# Patient Record
Sex: Female | Born: 1955 | Race: White | Hispanic: No | Marital: Married | Smoking: Never smoker
Health system: Southern US, Community
[De-identification: ages and names within clinical notes are randomized; demographics above are authoritative.]

## PROBLEM LIST (undated history)

## (undated) DIAGNOSIS — M810 Age-related osteoporosis without current pathological fracture: Secondary | ICD-10-CM

## (undated) DIAGNOSIS — T7840XA Allergy, unspecified, initial encounter: Secondary | ICD-10-CM

## (undated) DIAGNOSIS — R55 Syncope and collapse: Secondary | ICD-10-CM

## (undated) DIAGNOSIS — R569 Unspecified convulsions: Secondary | ICD-10-CM

## (undated) DIAGNOSIS — D649 Anemia, unspecified: Secondary | ICD-10-CM

## (undated) HISTORY — DX: Anemia, unspecified: D64.9

## (undated) HISTORY — DX: Age-related osteoporosis without current pathological fracture: M81.0

## (undated) HISTORY — DX: Syncope and collapse: R55

## (undated) HISTORY — PX: POLYPECTOMY: SHX149

## (undated) HISTORY — DX: Unspecified convulsions: R56.9

## (undated) HISTORY — DX: Allergy, unspecified, initial encounter: T78.40XA

## (undated) HISTORY — PX: COLONOSCOPY: SHX174

---

## 1999-08-25 ENCOUNTER — Encounter: Admission: RE | Admit: 1999-08-25 | Discharge: 1999-08-25 | Payer: Self-pay | Admitting: Emergency Medicine

## 1999-08-25 ENCOUNTER — Encounter: Payer: Self-pay | Admitting: Emergency Medicine

## 1999-09-01 ENCOUNTER — Encounter: Payer: Self-pay | Admitting: Emergency Medicine

## 1999-09-01 ENCOUNTER — Encounter: Admission: RE | Admit: 1999-09-01 | Discharge: 1999-09-01 | Payer: Self-pay | Admitting: Emergency Medicine

## 2000-08-02 ENCOUNTER — Other Ambulatory Visit: Admission: RE | Admit: 2000-08-02 | Discharge: 2000-08-02 | Payer: Self-pay | Admitting: Emergency Medicine

## 2000-09-02 ENCOUNTER — Encounter: Admission: RE | Admit: 2000-09-02 | Discharge: 2000-09-02 | Payer: Self-pay | Admitting: Emergency Medicine

## 2000-09-02 ENCOUNTER — Encounter: Payer: Self-pay | Admitting: Emergency Medicine

## 2002-05-20 ENCOUNTER — Encounter: Admission: RE | Admit: 2002-05-20 | Discharge: 2002-05-20 | Payer: Self-pay | Admitting: Emergency Medicine

## 2002-05-20 ENCOUNTER — Encounter: Payer: Self-pay | Admitting: Emergency Medicine

## 2003-01-15 ENCOUNTER — Encounter: Admission: RE | Admit: 2003-01-15 | Discharge: 2003-01-15 | Payer: Self-pay | Admitting: Emergency Medicine

## 2003-01-15 ENCOUNTER — Encounter: Payer: Self-pay | Admitting: Emergency Medicine

## 2003-10-22 ENCOUNTER — Encounter: Admission: RE | Admit: 2003-10-22 | Discharge: 2003-10-22 | Payer: Self-pay | Admitting: Emergency Medicine

## 2004-12-27 ENCOUNTER — Ambulatory Visit: Payer: Self-pay | Admitting: Gastroenterology

## 2005-02-06 ENCOUNTER — Ambulatory Visit: Payer: Self-pay | Admitting: Gastroenterology

## 2005-02-12 ENCOUNTER — Encounter: Admission: RE | Admit: 2005-02-12 | Discharge: 2005-02-12 | Payer: Self-pay | Admitting: Emergency Medicine

## 2005-02-21 ENCOUNTER — Encounter: Admission: RE | Admit: 2005-02-21 | Discharge: 2005-02-21 | Payer: Self-pay | Admitting: Emergency Medicine

## 2006-04-05 ENCOUNTER — Encounter: Admission: RE | Admit: 2006-04-05 | Discharge: 2006-04-05 | Payer: Self-pay | Admitting: Emergency Medicine

## 2006-11-17 IMAGING — US US TRANSVAGINAL NON-OB
1 series · 14 of 25 positions shown · non-contrast
Comparison: none

CLINICAL DATA: Abnormal pap smear with endometrial cells.
 ULTRASOUND OF THE PELVIS, TRANSVAGINAL ULTRASOUND:
 Transabdominal and transvaginal ultrasound of the pelvis were performed.  The uterus measures 12.9 cm sagittally with a depth of 3.8 cm and width of 5.7 cm.  Probable small fibroid is noted in the mid fundus of 1.1 x 0.7 x 1.0 cm.  The endometrium measures 8.1 mm in thickness and appears homogeneous.  The ovaries are normal in size with the left ovary not being well seen.  Only a small amount of free fluid is noted.

[Series 1: unknown · 0.32mm/px · 14 of 40 slices shown]
[im 1/40]
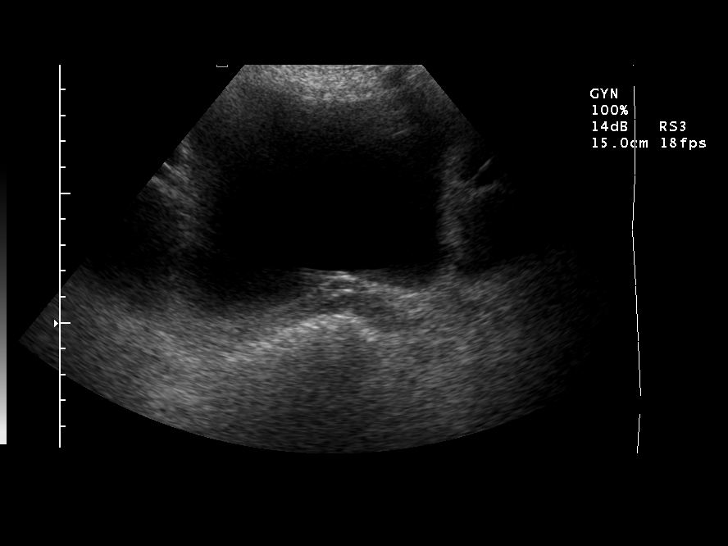
[im 4/40]
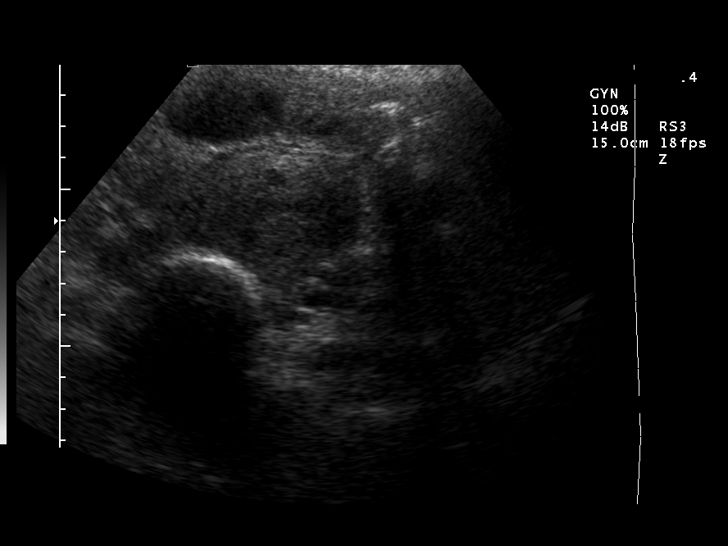
[im 7/40]
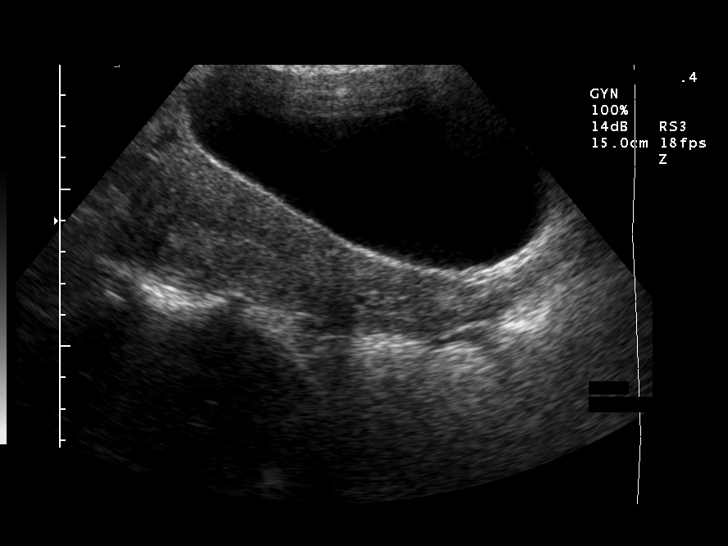
[im 10/40]
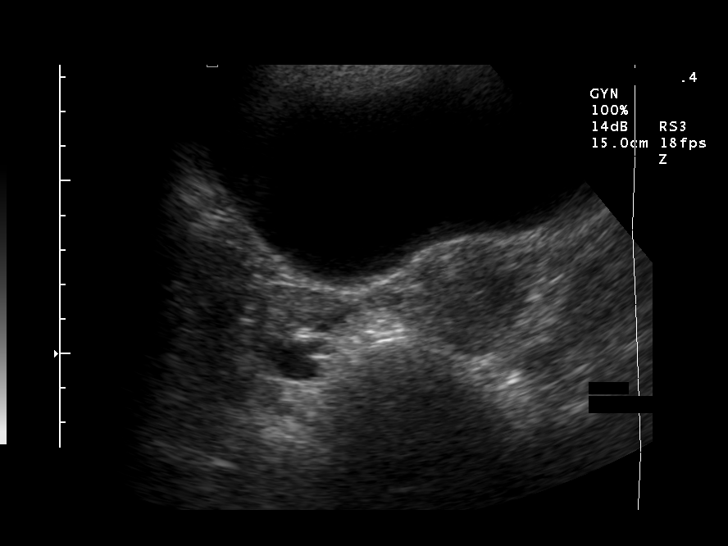
[im 14/40]
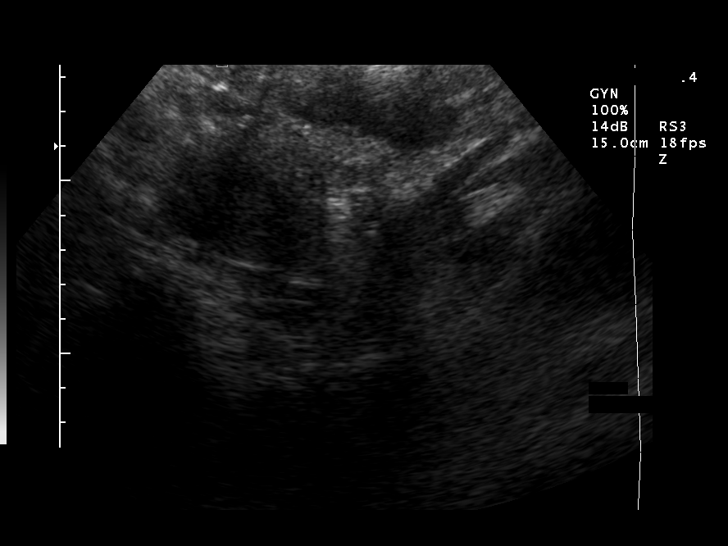
[im 15/40]
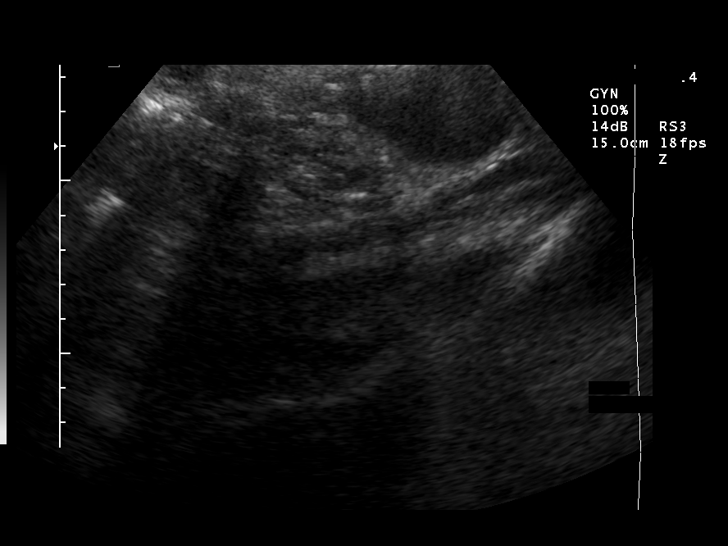
[im 18/40]
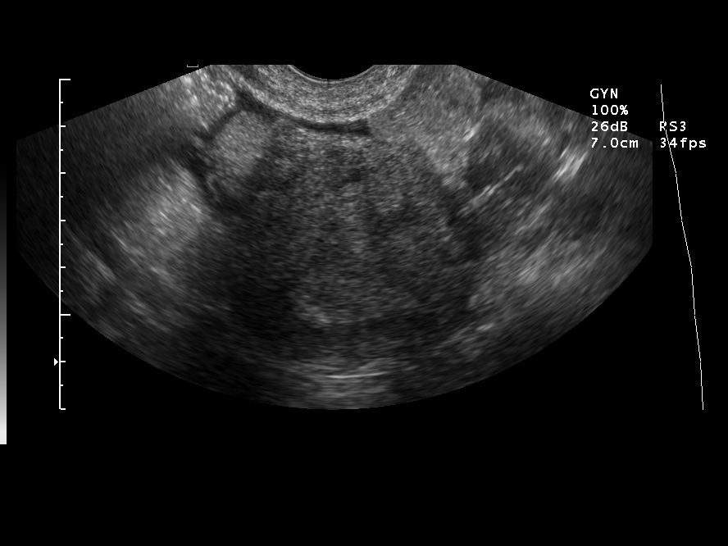
[im 22/40]
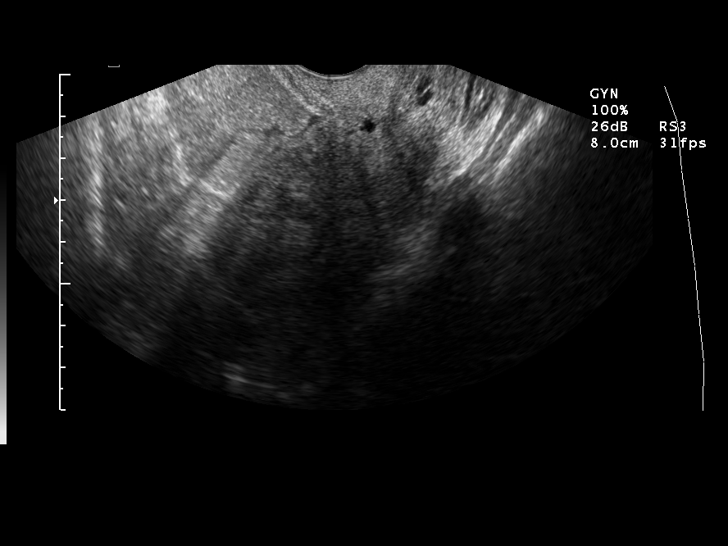
[im 25/40]
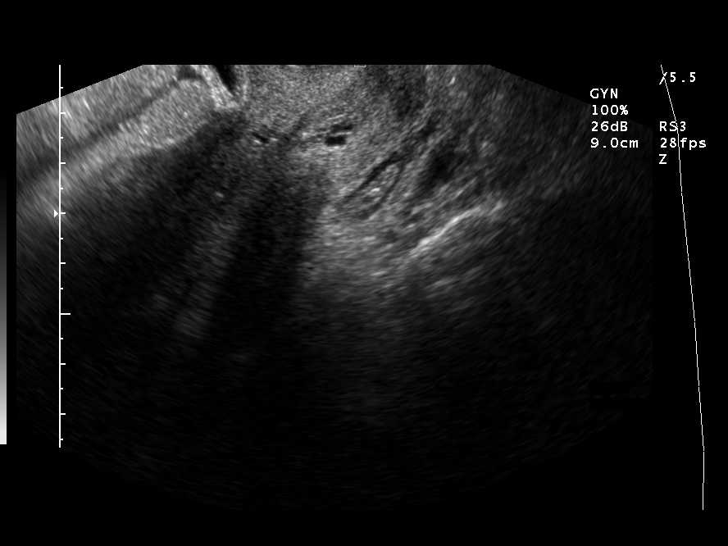
[im 27/40]
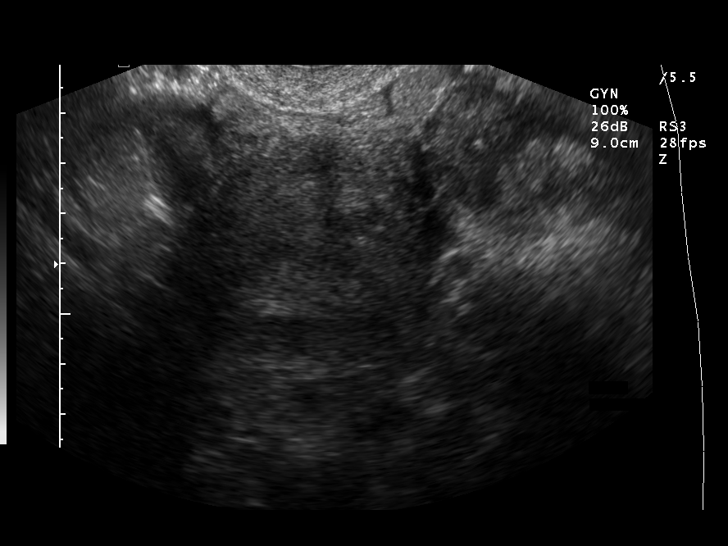
[im 30/40]
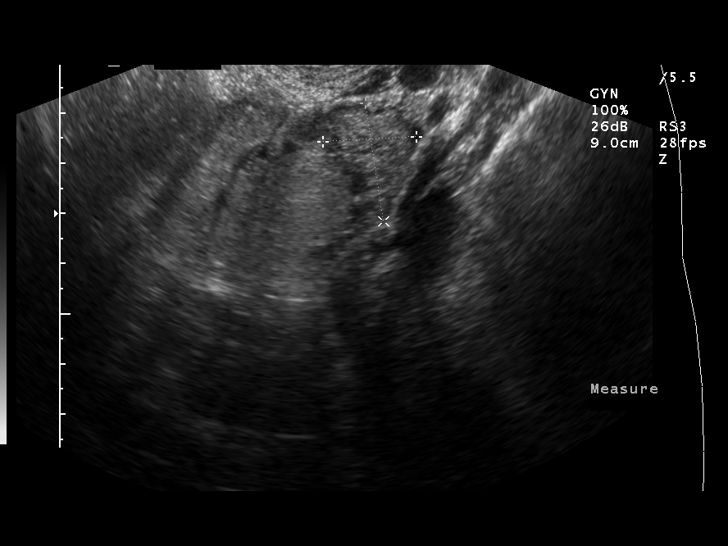
[im 33/40]
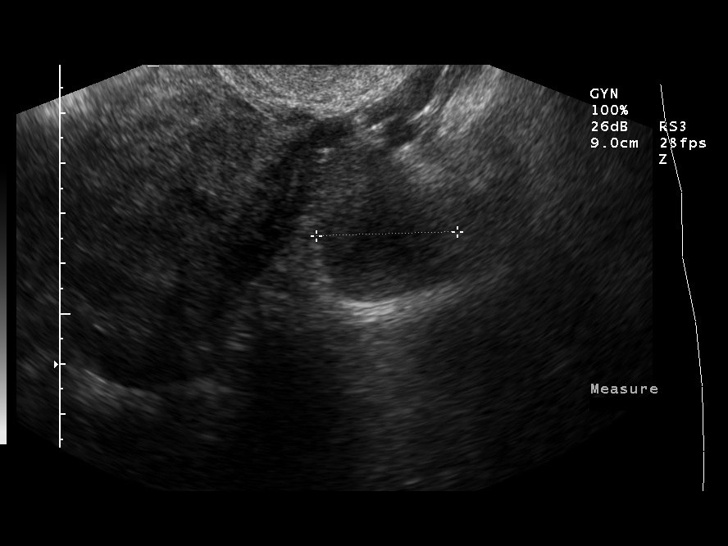
[im 36/40]
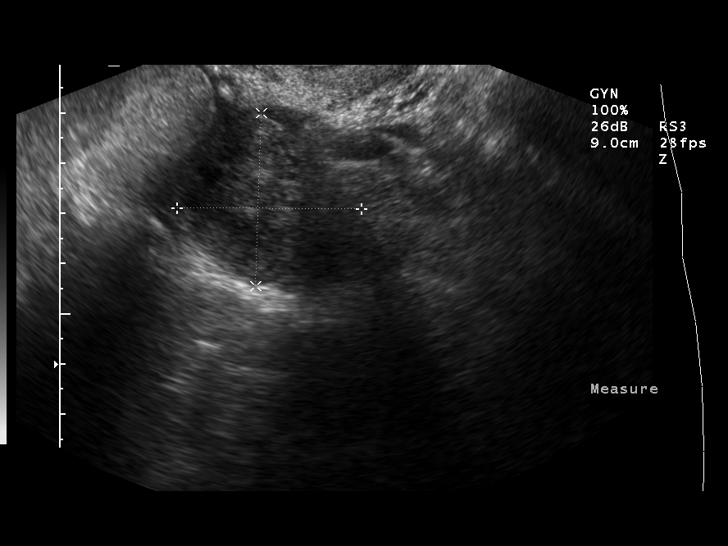
[im 40/40]
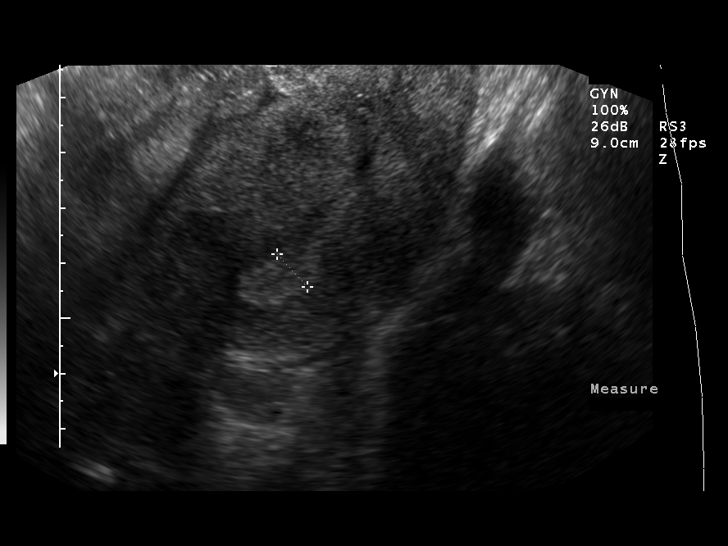

[14 of 25 positions shown; findings below may reference images not displayed]

IMPRESSION: Negative ultrasound of the pelvis.  Single small fundal fibroid of 1.1 cm.

## 2007-04-10 ENCOUNTER — Encounter: Admission: RE | Admit: 2007-04-10 | Discharge: 2007-04-10 | Payer: Self-pay | Admitting: Emergency Medicine

## 2008-04-19 ENCOUNTER — Encounter: Admission: RE | Admit: 2008-04-19 | Discharge: 2008-04-19 | Payer: Self-pay | Admitting: Emergency Medicine

## 2008-12-30 ENCOUNTER — Other Ambulatory Visit: Admission: RE | Admit: 2008-12-30 | Discharge: 2008-12-30 | Payer: Self-pay | Admitting: Family Medicine

## 2009-04-20 ENCOUNTER — Encounter: Admission: RE | Admit: 2009-04-20 | Discharge: 2009-04-20 | Payer: Self-pay | Admitting: Family Medicine

## 2009-04-21 ENCOUNTER — Encounter: Admission: RE | Admit: 2009-04-21 | Discharge: 2009-04-21 | Payer: Self-pay | Admitting: Family Medicine

## 2010-01-24 ENCOUNTER — Other Ambulatory Visit: Admission: RE | Admit: 2010-01-24 | Discharge: 2010-01-24 | Payer: Self-pay | Admitting: Family Medicine

## 2010-04-24 ENCOUNTER — Encounter: Admission: RE | Admit: 2010-04-24 | Discharge: 2010-04-24 | Payer: Self-pay | Admitting: Family Medicine

## 2011-01-29 ENCOUNTER — Other Ambulatory Visit (HOSPITAL_COMMUNITY)
Admission: RE | Admit: 2011-01-29 | Discharge: 2011-01-29 | Disposition: A | Discharge status: Home / Self Care | Payer: BC Managed Care – PPO | Source: Ambulatory Visit | Attending: Family Medicine | Admitting: Family Medicine

## 2011-01-29 ENCOUNTER — Other Ambulatory Visit: Payer: Self-pay | Admitting: Family Medicine

## 2011-01-29 DIAGNOSIS — Z124 Encounter for screening for malignant neoplasm of cervix: Secondary | ICD-10-CM | POA: Insufficient documentation

## 2011-04-05 ENCOUNTER — Other Ambulatory Visit: Payer: Self-pay | Admitting: Family Medicine

## 2011-04-05 DIAGNOSIS — Z1231 Encounter for screening mammogram for malignant neoplasm of breast: Secondary | ICD-10-CM

## 2011-04-30 ENCOUNTER — Ambulatory Visit
Admission: RE | Admit: 2011-04-30 | Discharge: 2011-04-30 | Disposition: A | Discharge status: Home / Self Care | Payer: BC Managed Care – PPO | Source: Ambulatory Visit | Attending: Family Medicine | Admitting: Family Medicine

## 2011-04-30 DIAGNOSIS — Z1231 Encounter for screening mammogram for malignant neoplasm of breast: Secondary | ICD-10-CM

## 2012-01-30 ENCOUNTER — Other Ambulatory Visit: Payer: Self-pay | Admitting: Family Medicine

## 2012-01-30 ENCOUNTER — Encounter: Payer: Self-pay | Admitting: Gastroenterology

## 2012-01-30 DIAGNOSIS — M810 Age-related osteoporosis without current pathological fracture: Secondary | ICD-10-CM

## 2012-02-14 ENCOUNTER — Ambulatory Visit
Admission: RE | Admit: 2012-02-14 | Discharge: 2012-02-14 | Disposition: A | Discharge status: Home / Self Care | Payer: BC Managed Care – PPO | Source: Ambulatory Visit | Attending: Family Medicine | Admitting: Family Medicine

## 2012-02-14 DIAGNOSIS — M810 Age-related osteoporosis without current pathological fracture: Secondary | ICD-10-CM

## 2012-03-18 ENCOUNTER — Ambulatory Visit (AMBULATORY_SURGERY_CENTER): Payer: BC Managed Care – PPO | Admitting: *Deleted

## 2012-03-18 VITALS — Ht 59.0 in | Wt 91.3 lb

## 2012-03-18 DIAGNOSIS — Z1211 Encounter for screening for malignant neoplasm of colon: Secondary | ICD-10-CM

## 2012-03-18 MED ORDER — NA SULFATE-K SULFATE-MG SULF 17.5-3.13-1.6 GM/177ML PO SOLN: 1.0000 | Freq: Once | ORAL | Status: DC

## 2012-03-19 ENCOUNTER — Encounter: Payer: Self-pay | Admitting: Gastroenterology

## 2012-04-01 ENCOUNTER — Encounter: Payer: Self-pay | Admitting: Gastroenterology

## 2012-04-01 ENCOUNTER — Ambulatory Visit (AMBULATORY_SURGERY_CENTER): Payer: BC Managed Care – PPO | Admitting: Gastroenterology

## 2012-04-01 VITALS — BP 139/87 | HR 76 | Temp 97.4°F | Resp 14 | Ht 59.0 in | Wt 91.0 lb

## 2012-04-01 DIAGNOSIS — D126 Benign neoplasm of colon, unspecified: Secondary | ICD-10-CM

## 2012-04-01 DIAGNOSIS — Z1211 Encounter for screening for malignant neoplasm of colon: Secondary | ICD-10-CM

## 2012-04-01 MED ORDER — SODIUM CHLORIDE 0.9 % IV SOLN: 500.0000 mL | INTRAVENOUS | Status: DC

## 2012-04-01 NOTE — Op Note (Signed)
[facility] [address] [place], [address]   COLONOSCOPY PROCEDURE REPORT  PATIENT: April, Kelly  MR#: [id number] BIRTHDATE: 13-Nov-1955 , 56  yrs. old GENDER: Female ENDOSCOPIST: Louis Meckel, MD REFERRED XB:JYNWGN Valentina Lucks, M.D. PROCEDURE DATE:  04/01/2012 PROCEDURE:   Colonoscopy with cold biopsy polypectomy ASA CLASS:   Class I INDICATIONS:average risk screening. MEDICATIONS: MAC sedation, administered by CRNA and propofol (Diprivan) 150mg  IV  DESCRIPTION OF PROCEDURE:   After the risks benefits and alternatives of the procedure were thoroughly explained, informed consent was obtained.  A digital rectal exam revealed no abnormalities of the rectum.   The LB [id number]  endoscope was introduced through the anus and advanced to the cecum, which was identified by both the appendix and ileocecal valve. No adverse events experienced.   The quality of the prep was Suprep excellent The instrument was then slowly withdrawn as the colon was fully examined.      COLON FINDINGS: A sessile polyp was found in the ascending colon.  A polypectomy was performed with cold forceps.  The resection was complete and the polyp tissue was completely retrieved.   The colon mucosa was otherwise normal.  Retroflexed views revealed no abnormalities. The time to cecum=5 minutes 08 seconds.  Withdrawal time=7 minutes 35 seconds.  The scope was withdrawn and the procedure completed. COMPLICATIONS: There were no complications.  ENDOSCOPIC IMPRESSION: 1.   Sessile polyp was found in the ascending colon; polypectomy was performed with cold forceps 2.   The colon mucosa was otherwise normal  RECOMMENDATIONS: If the polyp(s) removed today are proven to be adenomatous (pre-cancerous) polyps, you will need a repeat colonoscopy in 5 years.  Otherwise you should continue to follow colorectal cancer screening guidelines for "routine risk" patients with colonoscopy in  10 years.  You will receive a letter within 1-2 weeks with the results of your biopsy as well as final recommendations.  Please call my office if you have not received a letter after 3 weeks.   eSigned:  Louis Meckel, MD 04/01/2012 10:02 AM   cc:

## 2012-04-01 NOTE — Patient Instructions (Signed)
YOU HAD AN ENDOSCOPIC PROCEDURE TODAY AT THE [facility]: Refer to the procedure report that was given to you for any specific questions about what was found during the examination.  If the procedure report does not answer your questions, please call your gastroenterologist to clarify.  If you requested that your care partner not be given the details of your procedure findings, then the procedure report has been included in a sealed envelope for you to review at your convenience later.  YOU SHOULD EXPECT: Some feelings of bloating in the abdomen. Passage of more gas than usual.  Walking can help get rid of the air that was put into your GI tract during the procedure and reduce the bloating. If you had a lower endoscopy (such as a colonoscopy or flexible sigmoidoscopy) you may notice spotting of blood in your stool or on the toilet paper. If you underwent a bowel prep for your procedure, then you may not have a normal bowel movement for a few days.  DIET: Your first meal following the procedure should be a light meal and then it is ok to progress to your normal diet.  A half-sandwich or bowl of soup is an example of a good first meal.  Heavy or fried foods are harder to digest and may make you feel nauseous or bloated.  Likewise meals heavy in dairy and vegetables can cause extra gas to form and this can also increase the bloating.  Drink plenty of fluids but you should avoid alcoholic beverages for 24 hours.  ACTIVITY: Your care partner should take you home directly after the procedure.  You should plan to take it easy, moving slowly for the rest of the day.  You can resume normal activity the day after the procedure however you should NOT DRIVE or use heavy machinery for 24 hours (because of the sedation medicines used during the test).    SYMPTOMS TO REPORT IMMEDIATELY: A gastroenterologist can be reached at any hour.  During normal business hours, 8:30 AM to 5:00 PM Monday through Friday,  call [phone].  After hours and on weekends, please call the GI answering service at [phone] who will take a message and have the physician on call contact you.   Following lower endoscopy (colonoscopy or flexible sigmoidoscopy):  Excessive amounts of blood in the stool  Significant tenderness or worsening of abdominal pains  Swelling of the abdomen that is new, acute  Fever of 100F or higher   FOLLOW UP: If any biopsies were taken you will be contacted by phone or by letter within the next 1-3 weeks.  Call your gastroenterologist if you have not heard about the biopsies in 3 weeks.  Our staff will call the home number listed on your records the next business day following your procedure to check on you and address any questions or concerns that you may have at that time regarding the information given to you following your procedure. This is a courtesy call and so if there is no answer at the home number and we have not heard from you through the emergency physician on call, we will assume that you have returned to your regular daily activities without incident.  SIGNATURES/CONFIDENTIALITY: You and/or your care partner have signed paperwork which will be entered into your electronic medical record.  These signatures attest to the fact that that the information above on your After Visit Summary has been reviewed and is understood.  Full responsibility of the confidentiality of   this discharge information lies with you and/or your care-partner.   INFORMATION ON POLYPS GIVEN TO YOU TODAY 

## 2012-04-01 NOTE — Progress Notes (Signed)
Patient did not experience any of the following events: a burn prior to discharge; a fall within the facility; wrong site/side/patient/procedure/implant event; or a hospital transfer or hospital admission upon discharge from the facility. (G8907) Patient did not have preoperative order for IV antibiotic SSI prophylaxis. (G8918)  

## 2012-04-02 ENCOUNTER — Telehealth: Payer: Self-pay | Admitting: *Deleted

## 2012-04-02 NOTE — Telephone Encounter (Signed)
  Follow up Call-  Call back number 04/01/2012  Post procedure Call Back phone  # [phone]  Permission to leave phone message Yes     Patient questions:  Do you have a fever, pain , or abdominal swelling? no Pain Score  0 *  Have you tolerated food without any problems? yes  Have you been able to return to your normal activities? yes  Do you have any questions about your discharge instructions: Diet   no Medications  no Follow up visit  no  Do you have questions or concerns about your Care? no  Actions: * If pain score is 4 or above: No action needed, pain <4.

## 2012-04-08 ENCOUNTER — Encounter: Payer: Self-pay | Admitting: Gastroenterology

## 2012-04-09 ENCOUNTER — Other Ambulatory Visit: Payer: Self-pay | Admitting: Family Medicine

## 2012-04-09 DIAGNOSIS — Z1231 Encounter for screening mammogram for malignant neoplasm of breast: Secondary | ICD-10-CM

## 2012-05-02 ENCOUNTER — Ambulatory Visit
Admission: RE | Admit: 2012-05-02 | Discharge: 2012-05-02 | Disposition: A | Discharge status: Home / Self Care | Payer: BC Managed Care – PPO | Source: Ambulatory Visit | Attending: Family Medicine | Admitting: Family Medicine

## 2012-05-02 DIAGNOSIS — Z1231 Encounter for screening mammogram for malignant neoplasm of breast: Secondary | ICD-10-CM

## 2013-04-03 ENCOUNTER — Other Ambulatory Visit: Payer: Self-pay

## 2013-04-03 DIAGNOSIS — Z1231 Encounter for screening mammogram for malignant neoplasm of breast: Secondary | ICD-10-CM

## 2013-05-04 ENCOUNTER — Ambulatory Visit
Admission: RE | Admit: 2013-05-04 | Discharge: 2013-05-04 | Disposition: A | Discharge status: Home / Self Care | Payer: BC Managed Care – PPO | Source: Ambulatory Visit

## 2013-05-04 DIAGNOSIS — Z1231 Encounter for screening mammogram for malignant neoplasm of breast: Secondary | ICD-10-CM

## 2014-02-03 ENCOUNTER — Other Ambulatory Visit: Payer: Self-pay | Admitting: Family Medicine

## 2014-02-03 ENCOUNTER — Other Ambulatory Visit (HOSPITAL_COMMUNITY)
Admission: RE | Admit: 2014-02-03 | Discharge: 2014-02-03 | Disposition: A | Discharge status: Home / Self Care | Payer: BC Managed Care – PPO | Source: Ambulatory Visit | Attending: Family Medicine | Admitting: Family Medicine

## 2014-02-03 DIAGNOSIS — Z124 Encounter for screening for malignant neoplasm of cervix: Secondary | ICD-10-CM | POA: Insufficient documentation

## 2014-02-03 DIAGNOSIS — M810 Age-related osteoporosis without current pathological fracture: Secondary | ICD-10-CM

## 2014-02-04 LAB — CYTOLOGY - PAP

## 2014-02-24 ENCOUNTER — Ambulatory Visit
Admission: RE | Admit: 2014-02-24 | Discharge: 2014-02-24 | Disposition: A | Discharge status: Home / Self Care | Payer: BC Managed Care – PPO | Source: Ambulatory Visit | Attending: Family Medicine | Admitting: Family Medicine

## 2014-02-24 DIAGNOSIS — M810 Age-related osteoporosis without current pathological fracture: Secondary | ICD-10-CM

## 2014-04-07 ENCOUNTER — Other Ambulatory Visit: Payer: Self-pay

## 2014-04-07 DIAGNOSIS — Z1231 Encounter for screening mammogram for malignant neoplasm of breast: Secondary | ICD-10-CM

## 2014-05-10 ENCOUNTER — Ambulatory Visit
Admission: RE | Admit: 2014-05-10 | Discharge: 2014-05-10 | Disposition: A | Discharge status: Home / Self Care | Payer: BC Managed Care – PPO | Source: Ambulatory Visit

## 2014-05-10 DIAGNOSIS — Z1231 Encounter for screening mammogram for malignant neoplasm of breast: Secondary | ICD-10-CM

## 2014-12-22 ENCOUNTER — Encounter: Payer: Self-pay | Admitting: Gastroenterology

## 2015-04-19 ENCOUNTER — Other Ambulatory Visit: Payer: Self-pay

## 2015-04-19 DIAGNOSIS — Z1231 Encounter for screening mammogram for malignant neoplasm of breast: Secondary | ICD-10-CM

## 2015-05-16 ENCOUNTER — Ambulatory Visit
Admission: RE | Admit: 2015-05-16 | Discharge: 2015-05-16 | Disposition: A | Discharge status: Home / Self Care | Payer: BC Managed Care – PPO | Source: Ambulatory Visit

## 2015-05-16 DIAGNOSIS — Z1231 Encounter for screening mammogram for malignant neoplasm of breast: Secondary | ICD-10-CM

## 2016-02-13 ENCOUNTER — Other Ambulatory Visit: Payer: Self-pay | Admitting: Family Medicine

## 2016-02-13 ENCOUNTER — Ambulatory Visit
Admission: RE | Admit: 2016-02-13 | Discharge: 2016-02-13 | Disposition: A | Discharge status: Home / Self Care | Payer: BC Managed Care – PPO | Source: Ambulatory Visit | Attending: Family Medicine | Admitting: Family Medicine

## 2016-02-13 DIAGNOSIS — M79672 Pain in left foot: Secondary | ICD-10-CM

## 2016-02-15 ENCOUNTER — Other Ambulatory Visit: Payer: Self-pay | Admitting: Family Medicine

## 2016-02-15 DIAGNOSIS — M810 Age-related osteoporosis without current pathological fracture: Secondary | ICD-10-CM

## 2016-02-29 ENCOUNTER — Ambulatory Visit
Admission: RE | Admit: 2016-02-29 | Discharge: 2016-02-29 | Disposition: A | Discharge status: Home / Self Care | Payer: BC Managed Care – PPO | Source: Ambulatory Visit | Attending: Family Medicine | Admitting: Family Medicine

## 2016-02-29 DIAGNOSIS — M810 Age-related osteoporosis without current pathological fracture: Secondary | ICD-10-CM

## 2016-04-16 ENCOUNTER — Other Ambulatory Visit: Payer: Self-pay | Admitting: Family Medicine

## 2016-04-16 DIAGNOSIS — Z1231 Encounter for screening mammogram for malignant neoplasm of breast: Secondary | ICD-10-CM

## 2016-05-17 ENCOUNTER — Ambulatory Visit
Admission: RE | Admit: 2016-05-17 | Discharge: 2016-05-17 | Disposition: A | Discharge status: Home / Self Care | Payer: BC Managed Care – PPO | Source: Ambulatory Visit | Attending: Family Medicine | Admitting: Family Medicine

## 2016-05-17 DIAGNOSIS — Z1231 Encounter for screening mammogram for malignant neoplasm of breast: Secondary | ICD-10-CM

## 2017-03-27 ENCOUNTER — Other Ambulatory Visit (HOSPITAL_COMMUNITY)
Admission: RE | Admit: 2017-03-27 | Discharge: 2017-03-27 | Disposition: A | Discharge status: Home / Self Care | Payer: BC Managed Care – PPO | Source: Ambulatory Visit | Attending: Family Medicine | Admitting: Family Medicine

## 2017-03-27 ENCOUNTER — Encounter: Payer: Self-pay | Admitting: Gastroenterology

## 2017-03-27 ENCOUNTER — Other Ambulatory Visit: Payer: Self-pay | Admitting: Family Medicine

## 2017-03-27 DIAGNOSIS — Z01419 Encounter for gynecological examination (general) (routine) without abnormal findings: Secondary | ICD-10-CM | POA: Insufficient documentation

## 2017-03-29 LAB — CYTOLOGY - PAP: Diagnosis: NEGATIVE

## 2017-04-12 ENCOUNTER — Other Ambulatory Visit: Payer: Self-pay | Admitting: Family Medicine

## 2017-04-12 DIAGNOSIS — Z1231 Encounter for screening mammogram for malignant neoplasm of breast: Secondary | ICD-10-CM

## 2017-05-16 ENCOUNTER — Ambulatory Visit (AMBULATORY_SURGERY_CENTER): Payer: Self-pay | Admitting: *Deleted

## 2017-05-16 VITALS — Ht 59.0 in | Wt 97.4 lb

## 2017-05-16 DIAGNOSIS — Z86010 Personal history of colonic polyps: Secondary | ICD-10-CM

## 2017-05-16 MED ORDER — NA SULFATE-K SULFATE-MG SULF 17.5-3.13-1.6 GM/177ML PO SOLN: 1.0000 | Freq: Once | ORAL | 0 refills | Status: AC

## 2017-05-16 NOTE — Progress Notes (Signed)
No egg or soy allergy known to patient  No issues with past sedation with any surgeries  or procedures, no intubation problems  No diet pills per patient No home 02 use per patient  No blood thinners per patient  Pt denies issues with constipation  No A fib or A flutter  EMMI video sent to pt's e mail  

## 2017-05-20 ENCOUNTER — Ambulatory Visit
Admission: RE | Admit: 2017-05-20 | Discharge: 2017-05-20 | Disposition: A | Discharge status: Home / Self Care | Payer: BC Managed Care – PPO | Source: Ambulatory Visit | Attending: Family Medicine | Admitting: Family Medicine

## 2017-05-20 DIAGNOSIS — Z1231 Encounter for screening mammogram for malignant neoplasm of breast: Secondary | ICD-10-CM

## 2017-05-21 ENCOUNTER — Encounter: Payer: Self-pay | Admitting: Gastroenterology

## 2017-05-29 ENCOUNTER — Encounter: Payer: Self-pay | Admitting: Gastroenterology

## 2017-05-29 ENCOUNTER — Ambulatory Visit (AMBULATORY_SURGERY_CENTER): Payer: BC Managed Care – PPO | Admitting: Gastroenterology

## 2017-05-29 VITALS — BP 151/83 | HR 77 | Temp 97.3°F | Resp 7 | Ht 59.0 in | Wt 97.0 lb

## 2017-05-29 DIAGNOSIS — D120 Benign neoplasm of cecum: Secondary | ICD-10-CM

## 2017-05-29 DIAGNOSIS — Z86010 Personal history of colonic polyps: Secondary | ICD-10-CM

## 2017-05-29 MED ORDER — SODIUM CHLORIDE 0.9 % IV SOLN: 500.0000 mL | INTRAVENOUS | Status: DC

## 2017-05-29 NOTE — Progress Notes (Signed)
Pt's states no medical or surgical changes since previsit or office visit. 

## 2017-05-29 NOTE — Progress Notes (Signed)
To recovery, report to RN, VSS. 

## 2017-05-29 NOTE — Progress Notes (Signed)
Called to room to assist during endoscopic procedure.  Patient ID and intended procedure confirmed with present staff. Received instructions for my participation in the procedure from the performing physician.  

## 2017-05-29 NOTE — Patient Instructions (Signed)
Colon polyp x 1 removed today. Handouts given on polyps.  Result letter in your mail in 2-3 weeks. Call us with any questions or concerns. Thank you!  YOU HAD AN ENDOSCOPIC PROCEDURE TODAY AT [facility]:   Refer to the procedure report that was given to you for any specific questions about what was found during the examination.  If the procedure report does not answer your questions, please call your gastroenterologist to clarify.  If you requested that your care partner not be given the details of your procedure findings, then the procedure report has been included in a sealed envelope for you to review at your convenience later.  YOU SHOULD EXPECT: Some feelings of bloating in the abdomen. Passage of more gas than usual.  Walking can help get rid of the air that was put into your GI tract during the procedure and reduce the bloating. If you had a lower endoscopy (such as a colonoscopy or flexible sigmoidoscopy) you may notice spotting of blood in your stool or on the toilet paper. If you underwent a bowel prep for your procedure, you may not have a normal bowel movement for a few days.  Please Note:  You might notice some irritation and congestion in your nose or some drainage.  This is from the oxygen used during your procedure.  There is no need for concern and it should clear up in a day or so.  SYMPTOMS TO REPORT IMMEDIATELY:   Following lower endoscopy (colonoscopy or flexible sigmoidoscopy):  Excessive amounts of blood in the stool  Significant tenderness or worsening of abdominal pains  Swelling of the abdomen that is new, acute  Fever of 100F or higher  For urgent or emergent issues, a gastroenterologist can be reached at any hour by calling [phone].   DIET:  We do recommend a small meal at first, but then you may proceed to your regular diet.  Drink plenty of fluids but you should avoid alcoholic beverages for 24 hours.  ACTIVITY:  You should plan to take  it easy for the rest of today and you should NOT DRIVE or use heavy machinery until tomorrow (because of the sedation medicines used during the test).    FOLLOW UP: Our staff will call the number listed on your records the next business day following your procedure to check on you and address any questions or concerns that you may have regarding the information given to you following your procedure. If we do not reach you, we will leave a message.  However, if you are feeling well and you are not experiencing any problems, there is no need to return our call.  We will assume that you have returned to your regular daily activities without incident.  If any biopsies were taken you will be contacted by phone or by letter within the next 1-3 weeks.  Please call us at [phone] if you have not heard about the biopsies in 3 weeks.    SIGNATURES/CONFIDENTIALITY: You and/or your care partner have signed paperwork which will be entered into your electronic medical record.  These signatures attest to the fact that that the information above on your After Visit Summary has been reviewed and is understood.  Full responsibility of the confidentiality of this discharge information lies with you and/or your care-partner.

## 2017-05-29 NOTE — Op Note (Signed)
[facility] Patient Name: April Kelly Procedure Date: 05/29/2017 9:05 AM MRN: [id number] Endoscopist: Mallie Mussel L. Loletha Carrow , MD Age: 61 Referring MD:  Date of Birth: 01/28/1956 Gender: Female Account #: [id number] Procedure:                Colonoscopy Indications:              Surveillance: Personal history of adenomatous                            polyps on last colonoscopy 5 years ago (< 41mm                            tubular adenoma 03/2012) Medicines:                Monitored Anesthesia Care Procedure:                Pre-Anesthesia Assessment:                           - Prior to the procedure, a History and Physical                            was performed, and patient medications and                            allergies were reviewed. The patient's tolerance of                            previous anesthesia was also reviewed. The risks                            and benefits of the procedure and the sedation                            options and risks were discussed with the patient.                            All questions were answered, and informed consent                            was obtained. Prior Anticoagulants: The patient has                            taken no previous anticoagulant or antiplatelet                            agents. ASA Grade Assessment: II - A patient with                            mild systemic disease. After reviewing the risks                            and benefits, the patient was deemed in  satisfactory condition to undergo the procedure.                           After obtaining informed consent, the colonoscope                            was passed under direct vision. Throughout the                            procedure, the patient's blood pressure, pulse, and                            oxygen saturations were monitored continuously. The                            Colonoscope was introduced through the  anus and                            advanced to the the terminal ileum. The colonoscopy                            was performed without difficulty. The patient                            tolerated the procedure well. The quality of the                            bowel preparation was good. The terminal ileum,                            ileocecal valve, appendiceal orifice, and rectum                            were photographed. The quality of the bowel                            preparation was evaluated using the BBPS [facility]                            Bowel Preparation Scale) with scores of: Right                            Colon = 2, Transverse Colon = 2 and Left Colon = 2.                            The total BBPS score equals 6. The bowel                            preparation used was SUPREP. Scope In: 9:16:34 AM Scope Out: 9:36:08 AM Scope Withdrawal Time: 0 hours 13 minutes 49 seconds  Total Procedure Duration: 0 hours 19 minutes 34 seconds  Findings:                 The perianal and digital  rectal examinations were                            normal.                           The terminal ileum appeared normal.                           The colon (entire examined portion) was tortuous.                           A 4 mm polyp was found in the ileocecal valve. The                            polyp was sessile. The polyp was removed with a                            cold snare. Resection and retrieval were complete.                           The exam was otherwise without abnormality on                            direct and retroflexion views. Complications:            No immediate complications. Estimated Blood Loss:     Estimated blood loss: none. Impression:               - The examined portion of the ileum was normal.                           - Tortuous colon.                           - One 4 mm polyp at the ileocecal valve, removed                            with a cold  snare. Resected and retrieved.                           - The examination was otherwise normal on direct                            and retroflexion views. Recommendation:           - Patient has a contact number available for                            emergencies. The signs and symptoms of potential                            delayed complications were discussed with the                            patient. Return to normal activities tomorrow.  Written discharge instructions were provided to the                            patient.                           - Resume previous diet.                           - Continue present medications.                           - Await pathology results.                           - Repeat colonoscopy is recommended for                            surveillance. The colonoscopy date will be                            determined after pathology results from today's                            exam become available for review. Yeray Tomas L. Loletha Carrow, MD 05/29/2017 9:51:03 AM This report has been signed electronically.

## 2017-05-30 ENCOUNTER — Telehealth: Payer: Self-pay | Admitting: *Deleted

## 2017-05-30 NOTE — Telephone Encounter (Signed)
  Follow up Call-  Call back number 05/29/2017  Post procedure Call Back phone  # [phone]  Permission to leave phone message Yes  Some recent data might be hidden     Patient questions:  Do you have a fever, pain , or abdominal swelling? No. Pain Score  0 *  Have you tolerated food without any problems? Yes.    Have you been able to return to your normal activities? Yes.    Do you have any questions about your discharge instructions: Diet   No. Medications  No. Follow up visit  No.  Do you have questions or concerns about your Care? No.  Actions: * If pain score is 4 or above: No action needed, pain <4.

## 2017-05-30 NOTE — Telephone Encounter (Signed)
No answer for post procedure call back. Will attempt to call back later this afternoon. SM 

## 2017-06-03 ENCOUNTER — Encounter: Payer: Self-pay | Admitting: Gastroenterology

## 2018-04-24 ENCOUNTER — Other Ambulatory Visit: Payer: Self-pay | Admitting: Family Medicine

## 2018-04-24 DIAGNOSIS — Z1231 Encounter for screening mammogram for malignant neoplasm of breast: Secondary | ICD-10-CM

## 2018-05-28 ENCOUNTER — Ambulatory Visit
Admission: RE | Admit: 2018-05-28 | Discharge: 2018-05-28 | Disposition: A | Discharge status: Home / Self Care | Payer: BC Managed Care – PPO | Source: Ambulatory Visit | Attending: Family Medicine | Admitting: Family Medicine

## 2018-05-28 DIAGNOSIS — Z1231 Encounter for screening mammogram for malignant neoplasm of breast: Secondary | ICD-10-CM

## 2018-07-01 ENCOUNTER — Other Ambulatory Visit: Payer: Self-pay | Admitting: Family Medicine

## 2018-07-01 DIAGNOSIS — M818 Other osteoporosis without current pathological fracture: Secondary | ICD-10-CM

## 2018-08-22 ENCOUNTER — Ambulatory Visit
Admission: RE | Admit: 2018-08-22 | Discharge: 2018-08-22 | Disposition: A | Discharge status: Home / Self Care | Payer: BC Managed Care – PPO | Source: Ambulatory Visit | Attending: Family Medicine | Admitting: Family Medicine

## 2018-08-22 DIAGNOSIS — M818 Other osteoporosis without current pathological fracture: Secondary | ICD-10-CM

## 2019-04-21 ENCOUNTER — Other Ambulatory Visit: Payer: Self-pay | Admitting: Family Medicine

## 2019-04-21 DIAGNOSIS — Z1231 Encounter for screening mammogram for malignant neoplasm of breast: Secondary | ICD-10-CM

## 2019-06-05 ENCOUNTER — Ambulatory Visit
Admission: RE | Admit: 2019-06-05 | Discharge: 2019-06-05 | Disposition: A | Discharge status: Home / Self Care | Payer: BC Managed Care – PPO | Source: Ambulatory Visit | Attending: Family Medicine | Admitting: Family Medicine

## 2019-06-05 ENCOUNTER — Other Ambulatory Visit: Payer: Self-pay

## 2019-06-05 DIAGNOSIS — Z1231 Encounter for screening mammogram for malignant neoplasm of breast: Secondary | ICD-10-CM

## 2019-08-11 ENCOUNTER — Ambulatory Visit: Payer: Medicare Other

## 2019-08-20 ENCOUNTER — Ambulatory Visit: Payer: Medicare PPO | Attending: Internal Medicine

## 2019-08-20 DIAGNOSIS — Z23 Encounter for immunization: Secondary | ICD-10-CM

## 2019-08-20 NOTE — Progress Notes (Signed)
   Covid-19 Vaccination Clinic  Name:  April Kelly    MRN: [id number] DOB: [birth year]  08/20/2019  April Kelly was observed post Covid-19 immunization for 15 minutes without incidence. She was provided with Vaccine Information Sheet and instruction to access the V-Safe system.   April Kelly was instructed to call 911 with any severe reactions post vaccine: Marland Kitchen Difficulty breathing  . Swelling of your face and throat  . A fast heartbeat  . A bad rash all over your body  . Dizziness and weakness    Immunizations Administered    Name Date Dose VIS Date Route   Pfizer COVID-19 Vaccine 08/20/2019  3:31 PM 0.3 mL 06/26/2019 Intramuscular   Manufacturer: [place]   Lot: [id number]   [place]: [id number]

## 2019-09-14 ENCOUNTER — Ambulatory Visit: Payer: Medicare PPO | Attending: Internal Medicine

## 2019-09-14 DIAGNOSIS — Z23 Encounter for immunization: Secondary | ICD-10-CM | POA: Insufficient documentation

## 2019-09-14 NOTE — Progress Notes (Signed)
   Covid-19 Vaccination Clinic  Name:  April Kelly    MRN: [id number] DOB: February 20, 1956  09/14/2019  [name] was observed post Covid-19 immunization for 15 minutes without incidence. She was provided with Vaccine Information Sheet and instruction to access the V-Safe system.   [name] was instructed to call 911 with any severe reactions post vaccine: Marland Kitchen Difficulty breathing  . Swelling of your face and throat  . A fast heartbeat  . A bad rash all over your body  . Dizziness and weakness    Immunizations Administered    Name Date Dose VIS Date Route   Pfizer COVID-19 Vaccine 09/14/2019  5:13 PM 0.3 mL 06/26/2019 Intramuscular   Manufacturer: [place]   Lot: [id number]   [place]: [id number]

## 2020-05-27 ENCOUNTER — Other Ambulatory Visit: Payer: Self-pay | Admitting: Family Medicine

## 2020-05-27 DIAGNOSIS — Z1231 Encounter for screening mammogram for malignant neoplasm of breast: Secondary | ICD-10-CM

## 2020-06-06 ENCOUNTER — Other Ambulatory Visit: Payer: Self-pay

## 2020-06-06 ENCOUNTER — Ambulatory Visit
Admission: RE | Admit: 2020-06-06 | Discharge: 2020-06-06 | Disposition: A | Discharge status: Home / Self Care | Payer: Medicare PPO | Source: Ambulatory Visit

## 2020-06-06 DIAGNOSIS — Z1231 Encounter for screening mammogram for malignant neoplasm of breast: Secondary | ICD-10-CM

## 2020-10-13 DIAGNOSIS — M810 Age-related osteoporosis without current pathological fracture: Secondary | ICD-10-CM | POA: Diagnosis not present

## 2021-01-30 DIAGNOSIS — H2513 Age-related nuclear cataract, bilateral: Secondary | ICD-10-CM | POA: Diagnosis not present

## 2021-01-30 DIAGNOSIS — H5203 Hypermetropia, bilateral: Secondary | ICD-10-CM | POA: Diagnosis not present

## 2021-01-30 DIAGNOSIS — H52203 Unspecified astigmatism, bilateral: Secondary | ICD-10-CM | POA: Diagnosis not present

## 2021-04-17 DIAGNOSIS — M810 Age-related osteoporosis without current pathological fracture: Secondary | ICD-10-CM | POA: Diagnosis not present

## 2021-05-04 ENCOUNTER — Other Ambulatory Visit: Payer: Self-pay | Admitting: Family Medicine

## 2021-05-04 DIAGNOSIS — Z1231 Encounter for screening mammogram for malignant neoplasm of breast: Secondary | ICD-10-CM

## 2021-06-07 ENCOUNTER — Ambulatory Visit
Admission: RE | Admit: 2021-06-07 | Discharge: 2021-06-07 | Disposition: A | Discharge status: Home / Self Care | Payer: Medicare PPO | Source: Ambulatory Visit

## 2021-06-07 DIAGNOSIS — Z1231 Encounter for screening mammogram for malignant neoplasm of breast: Secondary | ICD-10-CM | POA: Diagnosis not present

## 2021-07-18 DIAGNOSIS — M810 Age-related osteoporosis without current pathological fracture: Secondary | ICD-10-CM | POA: Diagnosis not present

## 2021-07-18 DIAGNOSIS — F900 Attention-deficit hyperactivity disorder, predominantly inattentive type: Secondary | ICD-10-CM | POA: Diagnosis not present

## 2021-07-18 DIAGNOSIS — E7800 Pure hypercholesterolemia, unspecified: Secondary | ICD-10-CM | POA: Diagnosis not present

## 2021-07-18 DIAGNOSIS — Z23 Encounter for immunization: Secondary | ICD-10-CM | POA: Diagnosis not present

## 2021-07-18 DIAGNOSIS — Z0000 Encounter for general adult medical examination without abnormal findings: Secondary | ICD-10-CM | POA: Diagnosis not present

## 2021-07-18 DIAGNOSIS — N951 Menopausal and female climacteric states: Secondary | ICD-10-CM | POA: Diagnosis not present

## 2021-07-18 DIAGNOSIS — Z1389 Encounter for screening for other disorder: Secondary | ICD-10-CM | POA: Diagnosis not present

## 2021-08-03 ENCOUNTER — Other Ambulatory Visit: Payer: Self-pay | Admitting: Family Medicine

## 2021-08-03 DIAGNOSIS — M810 Age-related osteoporosis without current pathological fracture: Secondary | ICD-10-CM

## 2021-10-17 DIAGNOSIS — M810 Age-related osteoporosis without current pathological fracture: Secondary | ICD-10-CM | POA: Diagnosis not present

## 2021-10-26 DIAGNOSIS — N952 Postmenopausal atrophic vaginitis: Secondary | ICD-10-CM | POA: Diagnosis not present

## 2021-10-26 DIAGNOSIS — R300 Dysuria: Secondary | ICD-10-CM | POA: Diagnosis not present

## 2021-12-29 ENCOUNTER — Ambulatory Visit
Admission: RE | Admit: 2021-12-29 | Discharge: 2021-12-29 | Disposition: A | Discharge status: Home / Self Care | Payer: Medicare PPO | Source: Ambulatory Visit | Attending: Family Medicine | Admitting: Family Medicine

## 2021-12-29 DIAGNOSIS — M810 Age-related osteoporosis without current pathological fracture: Secondary | ICD-10-CM

## 2021-12-29 DIAGNOSIS — Z780 Asymptomatic menopausal state: Secondary | ICD-10-CM | POA: Diagnosis not present

## 2021-12-29 DIAGNOSIS — M85832 Other specified disorders of bone density and structure, left forearm: Secondary | ICD-10-CM | POA: Diagnosis not present

## 2022-01-31 DIAGNOSIS — H5203 Hypermetropia, bilateral: Secondary | ICD-10-CM | POA: Diagnosis not present

## 2022-01-31 DIAGNOSIS — H52203 Unspecified astigmatism, bilateral: Secondary | ICD-10-CM | POA: Diagnosis not present

## 2022-01-31 DIAGNOSIS — H2513 Age-related nuclear cataract, bilateral: Secondary | ICD-10-CM | POA: Diagnosis not present

## 2022-03-16 DIAGNOSIS — S61352A Open bite of right middle finger with damage to nail, initial encounter: Secondary | ICD-10-CM | POA: Diagnosis not present

## 2022-04-23 DIAGNOSIS — M810 Age-related osteoporosis without current pathological fracture: Secondary | ICD-10-CM | POA: Diagnosis not present

## 2022-04-27 ENCOUNTER — Other Ambulatory Visit: Payer: Self-pay | Admitting: Internal Medicine

## 2022-04-27 DIAGNOSIS — Z1231 Encounter for screening mammogram for malignant neoplasm of breast: Secondary | ICD-10-CM

## 2022-05-15 DIAGNOSIS — M79672 Pain in left foot: Secondary | ICD-10-CM | POA: Diagnosis not present

## 2022-05-16 ENCOUNTER — Encounter: Payer: Self-pay | Admitting: Gastroenterology

## 2022-05-16 DIAGNOSIS — M79672 Pain in left foot: Secondary | ICD-10-CM | POA: Diagnosis not present

## 2022-06-11 ENCOUNTER — Ambulatory Visit
Admission: RE | Admit: 2022-06-11 | Discharge: 2022-06-11 | Disposition: A | Discharge status: Home / Self Care | Payer: Medicare PPO | Source: Ambulatory Visit | Attending: Internal Medicine | Admitting: Internal Medicine

## 2022-06-11 DIAGNOSIS — Z1231 Encounter for screening mammogram for malignant neoplasm of breast: Secondary | ICD-10-CM | POA: Diagnosis not present

## 2022-06-26 DIAGNOSIS — M79672 Pain in left foot: Secondary | ICD-10-CM | POA: Diagnosis not present

## 2022-10-24 DIAGNOSIS — M810 Age-related osteoporosis without current pathological fracture: Secondary | ICD-10-CM | POA: Diagnosis not present

## 2023-02-04 DIAGNOSIS — H5203 Hypermetropia, bilateral: Secondary | ICD-10-CM | POA: Diagnosis not present

## 2023-02-04 DIAGNOSIS — H2513 Age-related nuclear cataract, bilateral: Secondary | ICD-10-CM | POA: Diagnosis not present

## 2023-02-04 DIAGNOSIS — H52203 Unspecified astigmatism, bilateral: Secondary | ICD-10-CM | POA: Diagnosis not present

## 2023-03-12 IMAGING — MG MM DIGITAL SCREENING BILAT W/ TOMO AND CAD
8 series · 9 of 24 positions shown · non-contrast
Comparison: Previous exam(s).

CLINICAL DATA: Screening.

EXAM:
DIGITAL SCREENING BILATERAL MAMMOGRAM WITH TOMOSYNTHESIS AND CAD
TECHNIQUE: Bilateral screening digital craniocaudal and mediolateral oblique
mammograms were obtained. Bilateral screening digital breast
tomosynthesis was performed. The images were evaluated with
computer-aided detection.

[L MLO synth-2D]
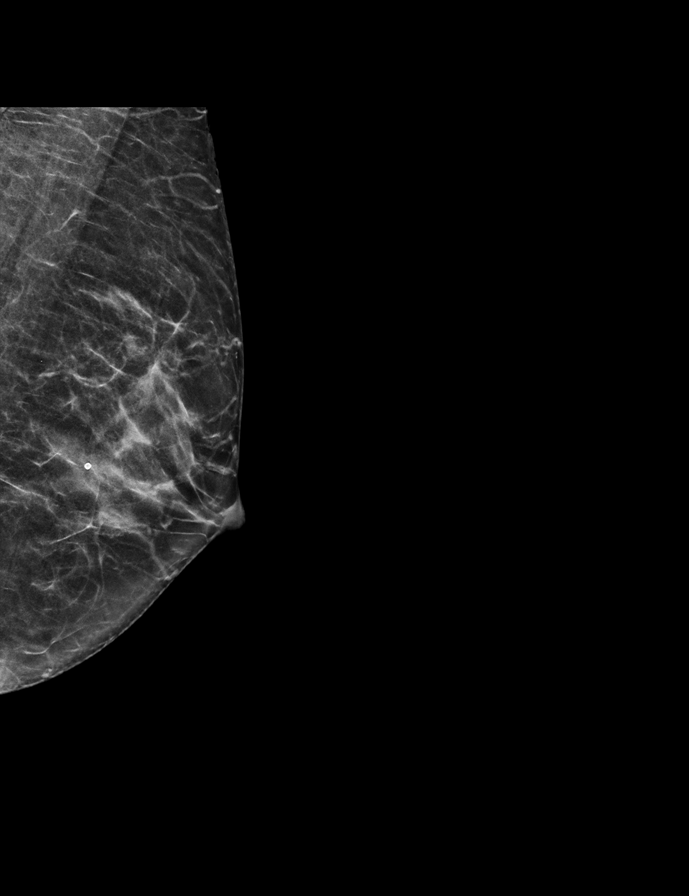

[R CC synth-2D]
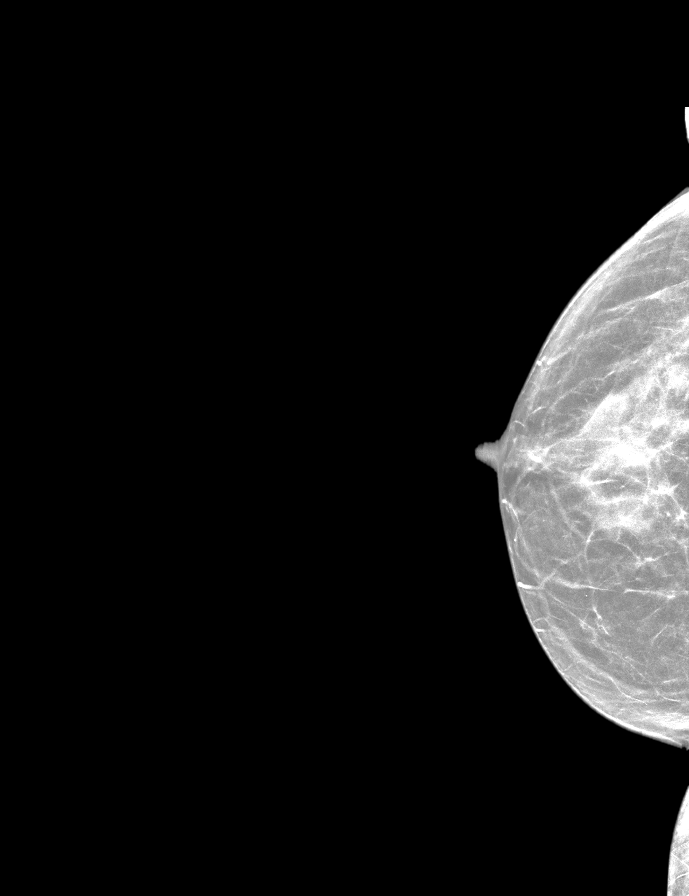

[R MLO synth-2D]
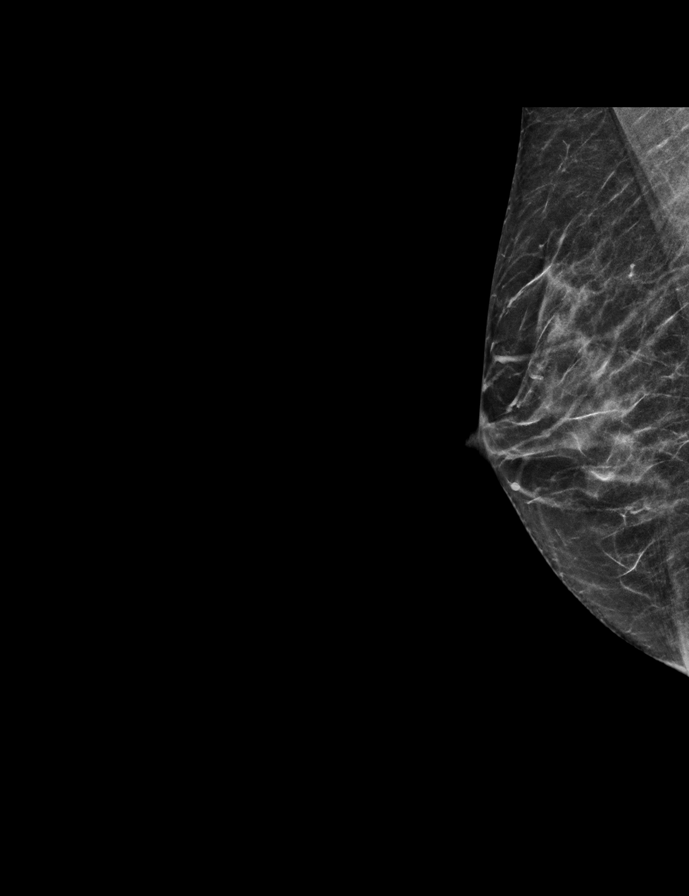

[L CC synth-2D]
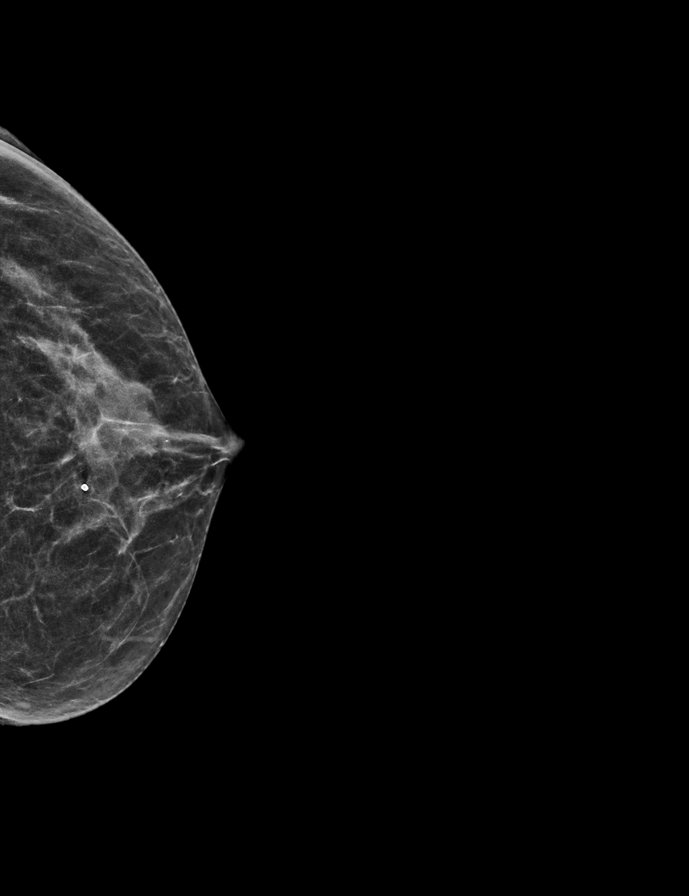

[R MLO tomo · 2 of 51 frames shown]
[frame 17/51]
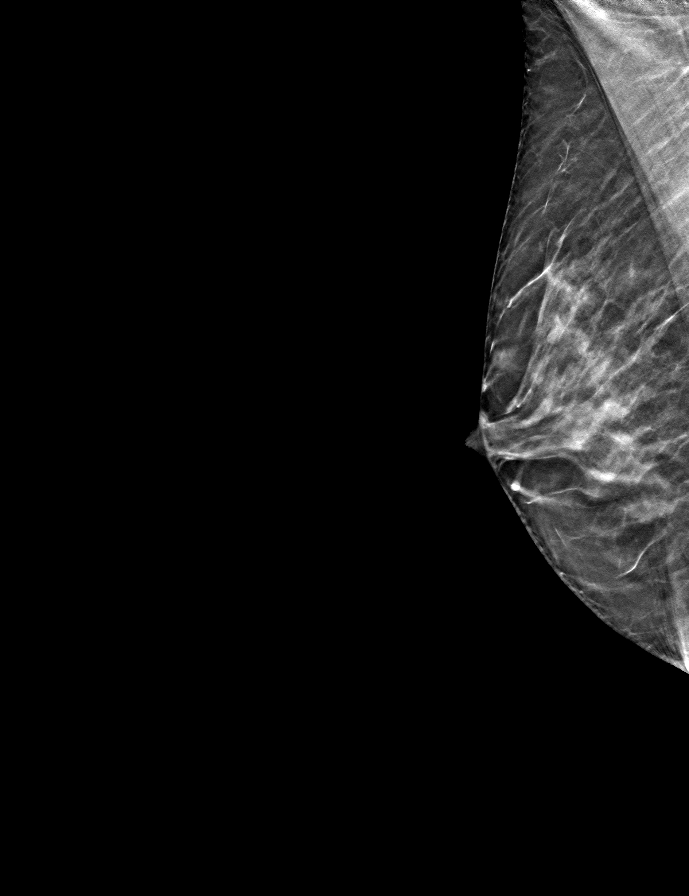
[frame 26/51]
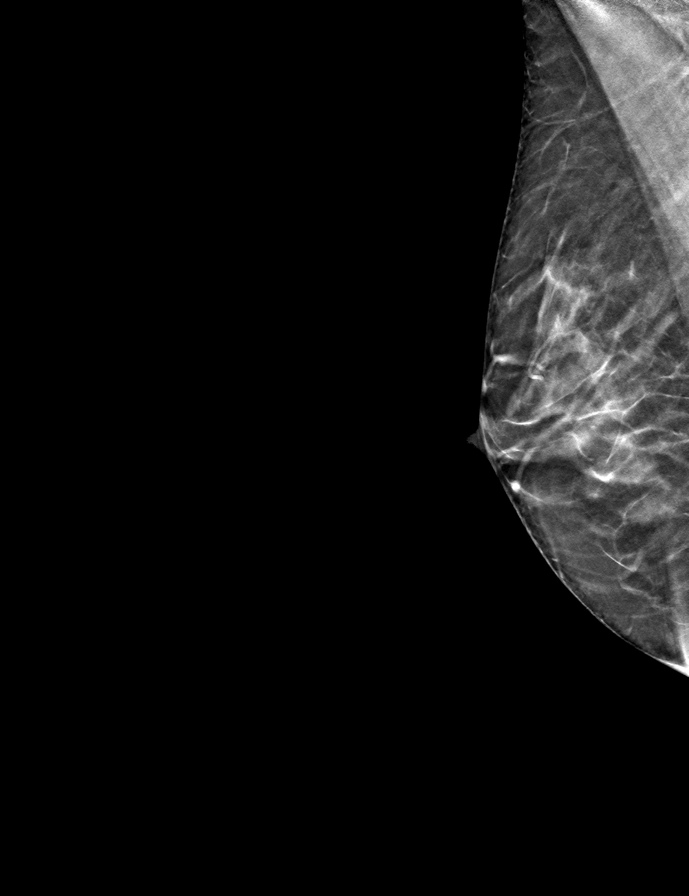

[R CC tomo · tomo slice 25/49.0]
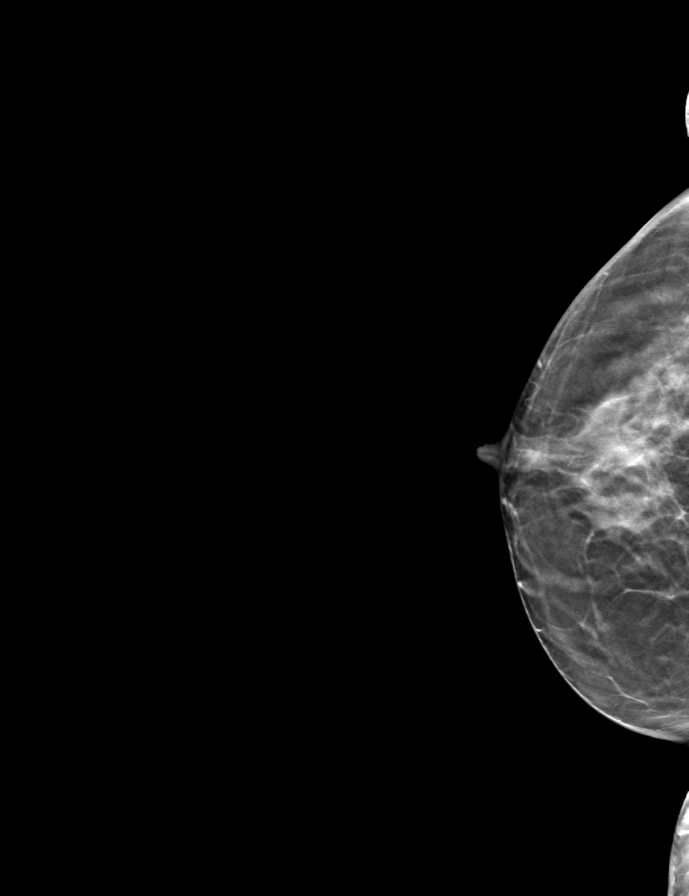

[L MLO tomo · tomo slice 25/49.0]
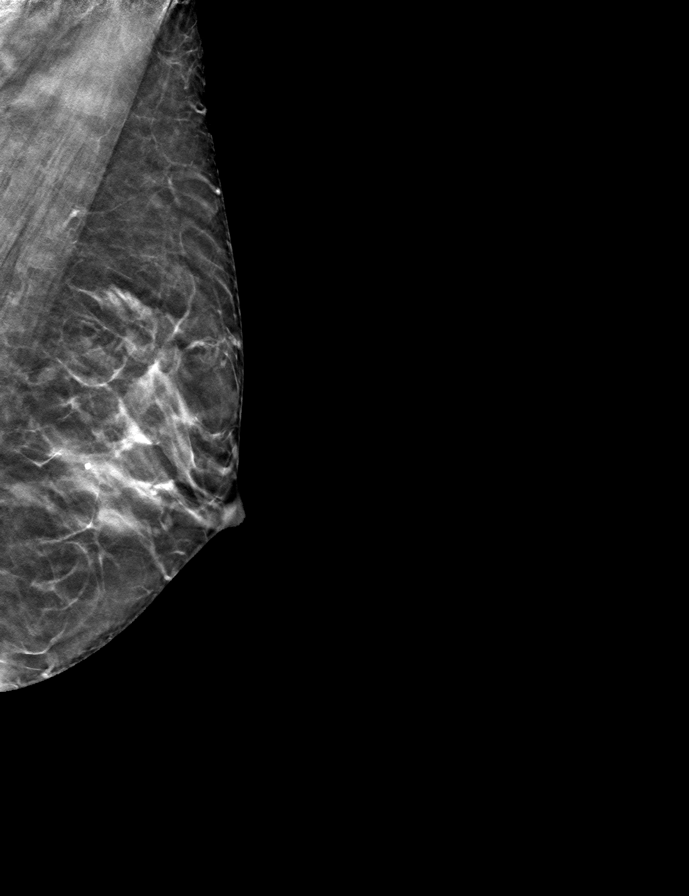

[L CC tomo · tomo slice 24/47.0]
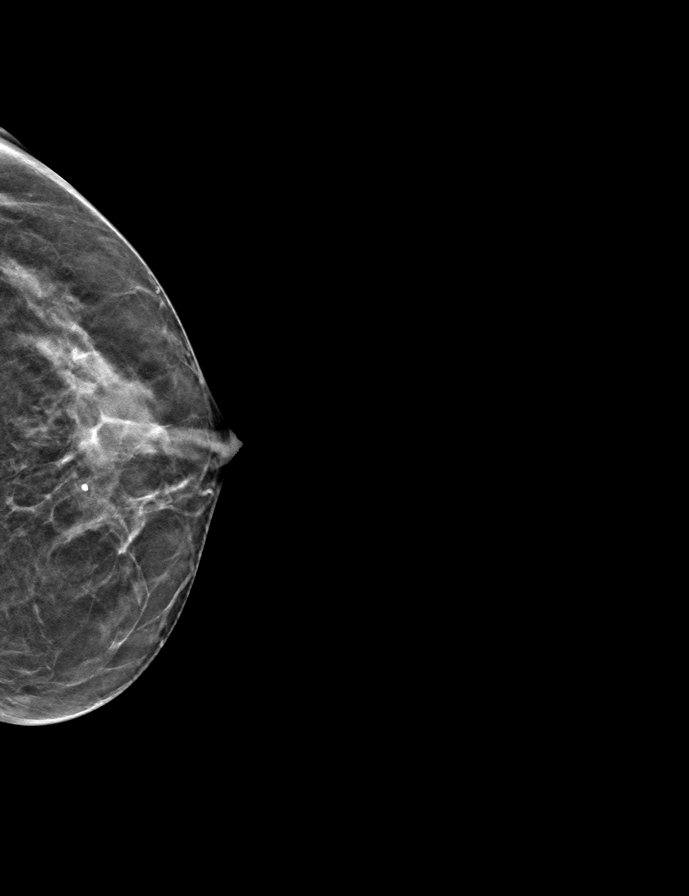

[9 of 24 positions shown; findings below may reference images not displayed]

ACR Breast Density Category c: The breast tissue is heterogeneously
dense, which may obscure small masses.
FINDINGS: There are no findings suspicious for malignancy.
IMPRESSION: No mammographic evidence of malignancy. A result letter of this
screening mammogram will be mailed directly to the patient.

RECOMMENDATION:
Screening mammogram in one year. (Code:Q3-W-BC3)

BI-RADS CATEGORY  1: Negative.

## 2023-04-26 DIAGNOSIS — Z23 Encounter for immunization: Secondary | ICD-10-CM | POA: Diagnosis not present

## 2023-04-26 DIAGNOSIS — M810 Age-related osteoporosis without current pathological fracture: Secondary | ICD-10-CM | POA: Diagnosis not present

## 2023-05-16 ENCOUNTER — Other Ambulatory Visit: Payer: Self-pay | Admitting: Internal Medicine

## 2023-05-16 DIAGNOSIS — Z0000 Encounter for general adult medical examination without abnormal findings: Secondary | ICD-10-CM

## 2023-06-17 ENCOUNTER — Ambulatory Visit
Admission: RE | Admit: 2023-06-17 | Discharge: 2023-06-17 | Disposition: A | Discharge status: Home / Self Care | Payer: Medicare PPO | Source: Ambulatory Visit | Attending: Internal Medicine

## 2023-06-17 DIAGNOSIS — Z1231 Encounter for screening mammogram for malignant neoplasm of breast: Secondary | ICD-10-CM | POA: Diagnosis not present

## 2023-06-17 DIAGNOSIS — Z0000 Encounter for general adult medical examination without abnormal findings: Secondary | ICD-10-CM

## 2023-07-24 DIAGNOSIS — M810 Age-related osteoporosis without current pathological fracture: Secondary | ICD-10-CM | POA: Diagnosis not present

## 2023-07-24 DIAGNOSIS — Z681 Body mass index (BMI) 19 or less, adult: Secondary | ICD-10-CM | POA: Diagnosis not present

## 2023-07-24 DIAGNOSIS — E7800 Pure hypercholesterolemia, unspecified: Secondary | ICD-10-CM | POA: Diagnosis not present

## 2023-07-24 DIAGNOSIS — Z0000 Encounter for general adult medical examination without abnormal findings: Secondary | ICD-10-CM | POA: Diagnosis not present

## 2023-07-24 DIAGNOSIS — F900 Attention-deficit hyperactivity disorder, predominantly inattentive type: Secondary | ICD-10-CM | POA: Diagnosis not present

## 2023-07-24 DIAGNOSIS — N951 Menopausal and female climacteric states: Secondary | ICD-10-CM | POA: Diagnosis not present

## 2023-10-28 DIAGNOSIS — M810 Age-related osteoporosis without current pathological fracture: Secondary | ICD-10-CM | POA: Diagnosis not present

## 2024-01-10 DIAGNOSIS — Z1159 Encounter for screening for other viral diseases: Secondary | ICD-10-CM | POA: Diagnosis not present

## 2024-02-05 DIAGNOSIS — H5203 Hypermetropia, bilateral: Secondary | ICD-10-CM | POA: Diagnosis not present

## 2024-02-05 DIAGNOSIS — H524 Presbyopia: Secondary | ICD-10-CM | POA: Diagnosis not present

## 2024-02-05 DIAGNOSIS — H2513 Age-related nuclear cataract, bilateral: Secondary | ICD-10-CM | POA: Diagnosis not present

## 2024-02-05 DIAGNOSIS — H52203 Unspecified astigmatism, bilateral: Secondary | ICD-10-CM | POA: Diagnosis not present

## 2024-04-29 DIAGNOSIS — M810 Age-related osteoporosis without current pathological fracture: Secondary | ICD-10-CM | POA: Diagnosis not present

## 2024-05-07 ENCOUNTER — Other Ambulatory Visit: Payer: Self-pay | Admitting: Internal Medicine

## 2024-05-07 DIAGNOSIS — Z1231 Encounter for screening mammogram for malignant neoplasm of breast: Secondary | ICD-10-CM

## 2024-06-17 ENCOUNTER — Ambulatory Visit
Admission: RE | Admit: 2024-06-17 | Discharge: 2024-06-17 | Disposition: A | Source: Ambulatory Visit | Attending: Internal Medicine

## 2024-06-17 DIAGNOSIS — Z1231 Encounter for screening mammogram for malignant neoplasm of breast: Secondary | ICD-10-CM | POA: Diagnosis not present

## 2024-07-03 ENCOUNTER — Encounter

## 2024-07-03 ENCOUNTER — Ambulatory Visit: Admitting: *Deleted

## 2024-07-03 VITALS — Ht 59.0 in | Wt 88.0 lb

## 2024-07-03 DIAGNOSIS — Z860100 Personal history of colon polyps, unspecified: Secondary | ICD-10-CM

## 2024-07-03 MED ORDER — NA SULFATE-K SULFATE-MG SULF 17.5-3.13-1.6 GM/177ML PO SOLN: 1.0000 | Freq: Once | ORAL | 0 refills | Status: AC

## 2024-07-03 NOTE — Progress Notes (Signed)
 Pt's name and DOB verified at the beginning of the pre-visit with 2 identifiers  Pt denies any difficulty with ambulating,sitting, laying down or rolling side to side  Pt has no issues moving head neck or swallowing  No egg or soy allergy known to patient   No issues known to pt with past sedation  No FH of Malignant Hyperthermia  Pt is not on home 02   Pt is not on blood thinners   Pt denies issues with constipation   Pt is not on dialysis  Pt denise any abnormal heart rhythms   Pt denies any upcoming cardiac testing  Patient's chart reviewed by Norleen Schillings CNRA prior to pre-visit and patient appropriate for the [facility].  Pre-visit completed and red dot placed by patient's name on their procedure day (on provider's schedule).    Visit by phone  Pt states weight is 88 lb  Pt given  both [facility] main # and MD on call # prior to instructions.  Informed pt to come in at the time discussed and is shown on PV instructions.  Pt instructed to use Singlecare.com or GoodRx for a price reduction on prep  Instructed pt where to find PV instructions in My Chart. . Instructed pt on all aspects of written instructions including med holds clothing to wear and foods to eat and not eat as well as after procedure legal restrictions and to call MD on call if needed.. Pt states understanding. Instructed pt to review instructions again prior to procedure and call main # given if has any questions or any issues. Pt states they will.

## 2024-07-21 ENCOUNTER — Encounter: Payer: Self-pay | Admitting: Gastroenterology

## 2024-07-24 ENCOUNTER — Encounter: Payer: Self-pay | Admitting: Gastroenterology

## 2024-07-24 ENCOUNTER — Ambulatory Visit: Admitting: Gastroenterology

## 2024-07-24 VITALS — BP 138/67 | HR 74 | Temp 97.1°F | Resp 13 | Ht 59.0 in | Wt 88.0 lb

## 2024-07-24 DIAGNOSIS — Z860100 Personal history of colon polyps, unspecified: Secondary | ICD-10-CM

## 2024-07-24 DIAGNOSIS — Q438 Other specified congenital malformations of intestine: Secondary | ICD-10-CM

## 2024-07-24 DIAGNOSIS — K635 Polyp of colon: Secondary | ICD-10-CM | POA: Diagnosis not present

## 2024-07-24 DIAGNOSIS — K648 Other hemorrhoids: Secondary | ICD-10-CM

## 2024-07-24 DIAGNOSIS — Z1211 Encounter for screening for malignant neoplasm of colon: Secondary | ICD-10-CM | POA: Diagnosis not present

## 2024-07-24 DIAGNOSIS — Z860101 Personal history of adenomatous and serrated colon polyps: Secondary | ICD-10-CM

## 2024-07-24 DIAGNOSIS — D120 Benign neoplasm of cecum: Secondary | ICD-10-CM

## 2024-07-24 MED ORDER — SODIUM CHLORIDE 0.9 % IV SOLN: 500.0000 mL | INTRAVENOUS | Status: DC

## 2024-07-24 NOTE — Progress Notes (Signed)
 Called to room to assist during endoscopic procedure.  Patient ID and intended procedure confirmed with present staff. Received instructions for my participation in the procedure from the performing physician.

## 2024-07-24 NOTE — Op Note (Signed)
 [facility] Patient Name: April Kelly Procedure Date: 07/24/2024 10:38 AM MRN: [id number] Endoscopist: Victory L. Legrand , MD, [id number] Age: 69 Referring MD:  Date of Birth: [birth year] Gender: Female Account #: [id number] Procedure:                Colonoscopy Indications:              Surveillance: Personal history of adenomatous                            polyps on last colonoscopy > 5 years ago                           Diminutive tubular adenoma November 2018 10 mm                            tubular adenoma September 2013 Medicines:                Monitored Anesthesia Care Procedure:                Pre-Anesthesia Assessment:                           - Prior to the procedure, a History and Physical                            was performed, and patient medications and                            allergies were reviewed. The patient's tolerance of                            previous anesthesia was also reviewed. The risks                            and benefits of the procedure and the sedation                            options and risks were discussed with the patient.                            All questions were answered, and informed consent                            was obtained. Prior Anticoagulants: The patient has                            taken no anticoagulant or antiplatelet agents. ASA                            Grade Assessment: II - A patient with mild systemic                            disease. After reviewing the risks and benefits,  the patient was deemed in satisfactory condition to                            undergo the procedure.                           After obtaining informed consent, the colonoscope                            was passed under direct vision. Throughout the                            procedure, the patient's blood pressure, pulse, and                            oxygen saturations were monitored  continuously. The                            Olympus Scope [id number] was introduced through the                            anus and advanced to the the cecum, identified by                            appendiceal orifice and ileocecal valve. The                            colonoscopy was somewhat difficult due to a                            redundant colon and a tortuous colon. Successful                            completion of the procedure was aided by using                            manual pressure and straightening and shortening                            the scope to obtain bowel loop reduction. The                            patient tolerated the procedure well. The quality                            of the bowel preparation was good. The ileocecal                            valve, appendiceal orifice, and rectum were                            photographed. The bowel preparation used was SUPREP. Scope In: 11:02:11 AM Scope Out: 11:19:04 AM Scope Withdrawal Time: 0 hours 11 minutes 50 seconds  Total Procedure Duration: 0 hours 16 minutes 53 seconds  Findings:                 The perianal and digital rectal examinations were                            normal.                           Repeat examination of right colon under NBI                            performed.                           A 15 mm polyp was found in the cecum. The polyp was                            mucous-capped and semi-sessile. The polyp was                            removed with a piecemeal technique using a cold                            snare. Resection and retrieval were complete.                           Internal hemorrhoids were found.                           The exam was otherwise without abnormality on                            direct and retroflexion views. Complications:            No immediate complications. Estimated Blood Loss:     Estimated blood loss was minimal. Impression:                - One 15 mm polyp in the cecum, removed piecemeal                            using a cold snare. Resected and retrieved.                           - Internal hemorrhoids.                           - The examination was otherwise normal on direct                            and retroflexion views. Recommendation:           - Patient has a contact number available for                            emergencies. The signs and symptoms of potential  delayed complications were discussed with the                            patient. Return to normal activities tomorrow.                            Written discharge instructions were provided to the                            patient.                           - Resume previous diet.                           - Continue present medications.                           - Await pathology results.                           - Repeat colonoscopy is recommended for                            surveillance (if polyp is TA or SSP). The                            colonoscopy date will be determined after pathology                            results from today's exam become available for                            review. Bird Swetz L. Legrand, MD 07/24/2024 11:26:36 AM This report has been signed electronically.

## 2024-07-24 NOTE — Patient Instructions (Signed)
" ° °  Handouts on polyps & hemorrhoids given to you today.   Await pathology results on polyps removed   Continue previous diet & medications  Repeat colonoscopy is recommended for surveillance . The colonoscopy date will be determined after pathology result.   YOU HAD AN ENDOSCOPIC PROCEDURE TODAY AT THE [facility]:   Refer to the procedure report that was given to you for any specific questions about what was found during the examination.  If the procedure report does not answer your questions, please call your gastroenterologist to clarify.  If you requested that your care partner not be given the details of your procedure findings, then the procedure report has been included in a sealed envelope for you to review at your convenience later.  YOU SHOULD EXPECT: Some feelings of bloating in the abdomen. Passage of more gas than usual.  Walking can help get rid of the air that was put into your GI tract during the procedure and reduce the bloating. If you had a lower endoscopy (such as a colonoscopy or flexible sigmoidoscopy) you may notice spotting of blood in your stool or on the toilet paper. If you underwent a bowel prep for your procedure, you may not have a normal bowel movement for a few days.  Please Note:  You might notice some irritation and congestion in your nose or some drainage.  This is from the oxygen used during your procedure.  There is no need for concern and it should clear up in a day or so.  SYMPTOMS TO REPORT IMMEDIATELY:  Following lower endoscopy (colonoscopy or flexible sigmoidoscopy):  Excessive amounts of blood in the stool  Significant tenderness or worsening of abdominal pains  Swelling of the abdomen that is new, acute  Fever of 100F or higher   For urgent or emergent issues, a gastroenterologist can be reached at any hour by calling [phone]. Do not use MyChart messaging for urgent concerns.    DIET:  We do recommend a small meal at  first, but then you may proceed to your regular diet.  Drink plenty of fluids but you should avoid alcoholic beverages for 24 hours.  ACTIVITY:  You should plan to take it easy for the rest of today and you should NOT DRIVE or use heavy machinery until tomorrow (because of the sedation medicines used during the test).    FOLLOW UP: Our staff will call the number listed on your records the next business day following your procedure.  We will call around 7:15- 8:00 am to check on you and address any questions or concerns that you may have regarding the information given to you following your procedure. If we do not reach you, we will leave a message.     If any biopsies were taken you will be contacted by phone or by letter within the next 1-3 weeks.  Please call us  at [phone] if you have not heard about the biopsies in 3 weeks.    SIGNATURES/CONFIDENTIALITY: You and/or your care partner have signed paperwork which will be entered into your electronic medical record.  These signatures attest to the fact that that the information above on your After Visit Summary has been reviewed and is understood.  Full responsibility of the confidentiality of this discharge information lies with you and/or your care-partner. "

## 2024-07-24 NOTE — Progress Notes (Signed)
 Pt's states no medical or surgical changes since previsit or office visit.

## 2024-07-24 NOTE — Progress Notes (Signed)
 To pacu, VSS. Report to [email or web address]

## 2024-07-24 NOTE — Progress Notes (Signed)
 History and Physical:  This patient presents for endoscopic testing for: Encounter Diagnosis  Name Primary?   Hx of colonic polyps Yes    69 year old woman here today for colon polyp surveillance. 10 mm tubular adenoma September 2013 and diminutive tubular adenoma on last colonoscopy November 2018.  Patient denies chronic abdominal pain, rectal bleeding, constipation or diarrhea.   Patient is otherwise without complaints or active issues today.   Past Medical History: Past Medical History:  Diagnosis Date   Allergy    Anemia    long ago    Arthritis    Cataract    Osteoporosis    Seizures (HCC)    with syncope only- last episode several yrs ago    Syncope      Past Surgical History: Past Surgical History:  Procedure Laterality Date   CESAREAN SECTION     x2   COLONOSCOPY     POLYPECTOMY      Allergies: Allergies[1]  Outpatient Meds: Current Outpatient Medications  Medication Sig Dispense Refill   cholecalciferol (VITAMIN D) 400 UNITS TABS Take 1,000 Units by mouth daily.     fish oil-omega-3 fatty acids 1000 MG capsule Take 1 g by mouth 2 (two) times daily.     gabapentin (NEURONTIN) 100 MG capsule 2 (two) times daily as needed.     loratadine (CLARITIN) 10 MG tablet Take 10 mg by mouth as needed.     STRATTERA 100 MG capsule      Calcium Carb-Cholecalciferol (LIQUID CALCIUM WITH D3 PO) Take 1 tablet by mouth 2 (two) times daily.     celecoxib (CELEBREX) 200 MG capsule as needed.     denosumab  (PROLIA ) 60 MG/ML SOLN injection Inject 60 mg into the skin every 6 (six) months. Administer in upper arm, thigh, or abdomen     fluticasone (FLONASE) 50 MCG/ACT nasal spray Place 2 sprays into both nostrils daily. (Patient taking differently: Place 2 sprays into both nostrils as needed.)     PREMPRO 0.45-1.5 MG per tablet 3 times a week and weaning (Patient not taking: Reported on 07/03/2024)     pseudoephedrine-acetaminophen (TYLENOL SINUS) 30-500 MG TABS Take 1 tablet  by mouth every 4 (four) hours as needed. (Patient not taking: Reported on 07/03/2024)     Current Facility-Administered Medications  Medication Dose Route Frequency Provider Last Rate Last Admin   0.9 %  sodium chloride  infusion  500 mL Intravenous Continuous Danis, Victory CROME III, MD       0.9 %  sodium chloride  infusion  500 mL Intravenous Continuous Legrand Victory CROME III, MD          ___________________________________________________________________ Objective   Exam:  BP (!) 145/84   Pulse 90   Temp (!) 97.1 F (36.2 C)   Ht 4' 11 (1.499 m)   Wt 88 lb (39.9 kg)   SpO2 100%   BMI 17.77 kg/m   CV: regular , S1/S2 Resp: clear to auscultation bilaterally, normal RR and effort noted GI: soft, no tenderness, with active bowel sounds.   Assessment: Encounter Diagnosis  Name Primary?   Hx of colonic polyps Yes     Plan: Colonoscopy   The benefits and risks of the planned procedure(s) were described in detail with the patient or (when appropriate) their health care proxy.  Risks were outlined as including, but not limited to, bleeding, infection, perforation, adverse medication reaction leading to cardiac or pulmonary decompensation, pancreatitis (if ERCP).  The limitation of incomplete mucosal visualization was also discussed.  No guarantees or warranties were given.  The patient was provided an opportunity to ask questions and all were answered. The patient agreed with the plan.   The patient is appropriate for an endoscopic procedure in the ambulatory setting.   - Victory Brand, MD        [1]  Allergies Allergen Reactions   Demerol [Meperidine] Nausea And Vomiting   Erythromycin Nausea And Vomiting

## 2024-07-27 ENCOUNTER — Telehealth: Payer: Self-pay

## 2024-07-27 NOTE — Telephone Encounter (Signed)
 Post procedure follow up call, no answer

## 2024-07-28 ENCOUNTER — Ambulatory Visit: Payer: Self-pay | Admitting: Gastroenterology

## 2024-07-28 LAB — SURGICAL PATHOLOGY
